# Patient Record
Sex: Female | Born: 1935 | Race: White | Hispanic: No | Marital: Single | Smoking: Former smoker
Health system: Southern US, Community
[De-identification: ages and names within clinical notes are randomized; demographics above are authoritative.]

## PROBLEM LIST (undated history)

## (undated) DIAGNOSIS — S0512XA Contusion of eyeball and orbital tissues, left eye, initial encounter: Secondary | ICD-10-CM

## (undated) DIAGNOSIS — N281 Cyst of kidney, acquired: Secondary | ICD-10-CM

## (undated) DIAGNOSIS — G450 Vertebro-basilar artery syndrome: Secondary | ICD-10-CM

## (undated) DIAGNOSIS — S62102A Fracture of unspecified carpal bone, left wrist, initial encounter for closed fracture: Secondary | ICD-10-CM

## (undated) DIAGNOSIS — S72411A Displaced unspecified condyle fracture of lower end of right femur, initial encounter for closed fracture: Secondary | ICD-10-CM

## (undated) DIAGNOSIS — C50919 Malignant neoplasm of unspecified site of unspecified female breast: Secondary | ICD-10-CM

## (undated) DIAGNOSIS — I4891 Unspecified atrial fibrillation: Secondary | ICD-10-CM

## (undated) DIAGNOSIS — I10 Essential (primary) hypertension: Secondary | ICD-10-CM

## (undated) DIAGNOSIS — I890 Lymphedema, not elsewhere classified: Secondary | ICD-10-CM

## (undated) DIAGNOSIS — K5792 Diverticulitis of intestine, part unspecified, without perforation or abscess without bleeding: Secondary | ICD-10-CM

## (undated) DIAGNOSIS — R1900 Intra-abdominal and pelvic swelling, mass and lump, unspecified site: Secondary | ICD-10-CM

## (undated) DIAGNOSIS — K259 Gastric ulcer, unspecified as acute or chronic, without hemorrhage or perforation: Secondary | ICD-10-CM

## (undated) HISTORY — DX: Unspecified atrial fibrillation: I48.91

## (undated) HISTORY — DX: Lymphedema, not elsewhere classified: I89.0

## (undated) HISTORY — DX: Gastric ulcer, unspecified as acute or chronic, without hemorrhage or perforation: K25.9

## (undated) HISTORY — DX: Displaced unspecified condyle fracture of lower end of right femur, initial encounter for closed fracture: S72.411A

## (undated) HISTORY — DX: Intra-abdominal and pelvic swelling, mass and lump, unspecified site: R19.00

## (undated) HISTORY — DX: Cyst of kidney, acquired: N28.1

## (undated) HISTORY — DX: Diverticulitis of intestine, part unspecified, without perforation or abscess without bleeding: K57.92

## (undated) HISTORY — PX: HIP SURGERY: SHX245

## (undated) HISTORY — PX: TONSILLECTOMY: SUR1361

## (undated) HISTORY — DX: Contusion of eyeball and orbital tissues, left eye, initial encounter: S05.12XA

## (undated) HISTORY — DX: Fracture of unspecified carpal bone, left wrist, initial encounter for closed fracture: S62.102A

## (undated) HISTORY — DX: Malignant neoplasm of unspecified site of unspecified female breast: C50.919

## (undated) HISTORY — PX: APPENDECTOMY: SHX54

## (undated) HISTORY — DX: Vertebro-basilar artery syndrome: G45.0

## (undated) HISTORY — DX: Essential (primary) hypertension: I10

---

## 2005-01-05 ENCOUNTER — Ambulatory Visit (HOSPITAL_COMMUNITY): Admission: RE | Admit: 2005-01-05 | Discharge: 2005-01-05 | Payer: Self-pay | Admitting: Neurosurgery

## 2005-01-13 ENCOUNTER — Ambulatory Visit (HOSPITAL_COMMUNITY): Admission: RE | Admit: 2005-01-13 | Discharge: 2005-01-13 | Payer: Self-pay | Admitting: Neurosurgery

## 2010-11-27 ENCOUNTER — Encounter: Payer: Self-pay | Admitting: Neurosurgery

## 2016-08-01 LAB — CBC AND DIFFERENTIAL
HEMATOCRIT: 37 % (ref 36–46)
Hemoglobin: 11.9 g/dL — AB (ref 12.0–16.0)
PLATELETS: 195 10*3/uL (ref 150–399)
WBC: 6 10*3/mL

## 2016-08-01 LAB — HEPATIC FUNCTION PANEL
ALT: 15 U/L (ref 7–35)
AST: 16 U/L (ref 13–35)
BILIRUBIN, TOTAL: 0.3 mg/dL

## 2016-08-01 LAB — BASIC METABOLIC PANEL
BUN: 28 mg/dL — AB (ref 4–21)
CREATININE: 0.8 mg/dL (ref 0.5–1.1)
Glucose: 121 mg/dL
Potassium: 4.5 mmol/L (ref 3.4–5.3)
SODIUM: 141 mmol/L (ref 137–147)

## 2016-08-01 LAB — POCT INR: INR: 2.3 — AB (ref 0.9–1.1)

## 2016-08-02 LAB — POCT INR: INR: 2.1 — AB (ref 0.9–1.1)

## 2016-08-03 LAB — BASIC METABOLIC PANEL
BUN: 15 mg/dL (ref 4–21)
CREATININE: 0.5 mg/dL (ref 0.5–1.1)
GLUCOSE: 155 mg/dL
POTASSIUM: 4.6 mmol/L (ref 3.4–5.3)
SODIUM: 137 mmol/L (ref 137–147)

## 2016-08-03 LAB — CBC AND DIFFERENTIAL
HCT: 30 % — AB (ref 36–46)
HEMOGLOBIN: 9.8 g/dL — AB (ref 12.0–16.0)
Platelets: 151 10*3/uL (ref 150–399)
WBC: 7.8 10^3/mL

## 2016-08-04 LAB — POCT INR: INR: 1.5 — AB (ref 0.9–1.1)

## 2016-08-04 LAB — CBC AND DIFFERENTIAL
HEMATOCRIT: 28 % — AB (ref 36–46)
HEMOGLOBIN: 9 g/dL — AB (ref 12.0–16.0)
Platelets: 144 10*3/uL — AB (ref 150–399)
WBC: 8 10*3/mL

## 2016-08-05 LAB — POCT INR: INR: 2 — AB (ref 0.9–1.1)

## 2016-08-06 DIAGNOSIS — S72411A Displaced unspecified condyle fracture of lower end of right femur, initial encounter for closed fracture: Secondary | ICD-10-CM

## 2016-08-06 HISTORY — DX: Displaced unspecified condyle fracture of lower end of right femur, initial encounter for closed fracture: S72.411A

## 2016-08-06 HISTORY — PX: ORIF FEMUR FRACTURE: SHX2119

## 2016-08-06 LAB — POCT INR: INR: 2.2 — AB (ref 0.9–1.1)

## 2016-08-07 LAB — CBC AND DIFFERENTIAL
HCT: 26 % — AB (ref 36–46)
HEMOGLOBIN: 8.6 g/dL — AB (ref 12.0–16.0)
Platelets: 196 10*3/uL (ref 150–399)
WBC: 6 10^3/mL

## 2016-08-07 LAB — POCT INR: INR: 1.9 — AB (ref 0.9–1.1)

## 2016-08-08 ENCOUNTER — Non-Acute Institutional Stay (SKILLED_NURSING_FACILITY): Payer: Medicare Other | Admitting: Internal Medicine

## 2016-08-08 ENCOUNTER — Encounter: Payer: Self-pay | Admitting: Internal Medicine

## 2016-08-08 DIAGNOSIS — S62102S Fracture of unspecified carpal bone, left wrist, sequela: Secondary | ICD-10-CM

## 2016-08-08 DIAGNOSIS — I4891 Unspecified atrial fibrillation: Secondary | ICD-10-CM | POA: Diagnosis not present

## 2016-08-08 DIAGNOSIS — G459 Transient cerebral ischemic attack, unspecified: Secondary | ICD-10-CM | POA: Diagnosis not present

## 2016-08-08 DIAGNOSIS — K5901 Slow transit constipation: Secondary | ICD-10-CM | POA: Diagnosis not present

## 2016-08-08 DIAGNOSIS — I10 Essential (primary) hypertension: Secondary | ICD-10-CM | POA: Diagnosis not present

## 2016-08-08 DIAGNOSIS — R2681 Unsteadiness on feet: Secondary | ICD-10-CM | POA: Diagnosis not present

## 2016-08-08 DIAGNOSIS — D62 Acute posthemorrhagic anemia: Secondary | ICD-10-CM | POA: Diagnosis not present

## 2016-08-08 DIAGNOSIS — S72401S Unspecified fracture of lower end of right femur, sequela: Secondary | ICD-10-CM

## 2016-08-08 NOTE — Progress Notes (Signed)
LOCATION: [facility]  PCP: No primary care provider on file.   Code Status: DNR  Goals of care: Advanced Directive information Advanced Directives 08/08/2016  Does patient have an advance directive? Yes  Type of Advance Directive Out of facility DNR (pink MOST or yellow form)  Does patient want to make changes to advanced directive? No - Patient declined  Copy of advanced directive(s) in chart? Yes       Extended Emergency Contact Information Primary Emergency Contact: Wright,Greg Address: [address], [place] [address] [place] of [place] Phone: [phone] Mobile Phone: [phone] Relation: Son Secondary Emergency Contact: Clydene Fake Address: [address]          [place], [place] [address] [place] Phone: [phone] Mobile Phone: [phone] Relation: Daughter   Allergies  Allergen Reactions  . Sulfa Antibiotics     Chief Complaint  Patient presents with  . New Admit To SNF    New Admission Visit     HPI:  Patient is a 82 y.o. female seen today for short term rehabilitation post hospital admission from 08/01/16-08/07/16 post fall with right femoral closed fracture and left wrist closed fracture. She had cast placed to LUE and ORIF to her right femur. She has PMH of TIA, breast cancer, HTN, afib among others. She is seen in her room today with her son present.   Review of Systems:  Constitutional: Negative for fever, chills, diaphoresis.  HENT: Negative for headache, congestion, nasal discharge Eyes: Negative for blurred vision, double vision and discharge.  Respiratory: Negative for shortness of breath and wheezing. Positive for occasional cough.  Cardiovascular: Negative for chest pain, palpitations, leg swelling.  Gastrointestinal: Negative for heartburn, nausea, vomiting, abdominal pain. Positive for poor appetite. Last bowel movement was Thursday and then she has had a small bowel movement. She  has been passing flatus. Genitourinary: Negative for dysuria Musculoskeletal: Negative for back pain, fall in the facility.  Skin: Negative for itching, rash.  Neurological: Positive for occasional dizziness Psychiatric/Behavioral: Negative for depression   Past Medical History:  Diagnosis Date  . Atrial fibrillation ([place])   . Breast cancer ([place])   . Diverticulitis   . Gastric ulcer   . Hypertension   . Lymph edema    Past Surgical History:  Procedure Laterality Date  . APPENDECTOMY    . HIP SURGERY    . TONSILLECTOMY     Social History:   reports that she has quit smoking. She does not have any smokeless tobacco history on file. She reports that she drinks about 4.2 oz of alcohol per week . Her drug history is not on file.  Family History  Problem Relation Age of Onset  . Heart disease Mother     Medications:   Medication List       Accurate as of 08/08/16 12:30 PM. Always use your most recent med list.          atorvastatin 20 MG tablet Commonly known as:  LIPITOR Take 20 mg by mouth daily.   diltiazem 60 MG tablet Commonly known as:  CARDIZEM Take 50 mg by mouth 2 (two) times daily.   flecainide 50 MG tablet Commonly known as:  TAMBOCOR Take 50 mg by mouth 2 (two) times daily.   HYDROcodone-acetaminophen 5-325 MG tablet Commonly known as:  NORCO/VICODIN Take 1 tablet by mouth every 4 (four) hours as needed for moderate pain.   lisinopril  20 MG tablet Commonly known as:  PRINIVIL,ZESTRIL Take 20 mg by mouth daily.   warfarin 5 MG tablet Commonly known as:  COUMADIN Take 5 mg by mouth daily. Stop date 08/10/16       Immunizations:  There is no immunization history on file for this patient.   Physical Exam:  Vitals:   08/08/16 1209  BP: 126/74  Pulse: 78  Resp: 18  Temp: 98.5 F (36.9 C)  TempSrc: Oral  SpO2: 98%  Weight: 158 lb (71.7 kg)  Height: 5\' 5"  (1.651 m)   Body mass index is 26.29 kg/m.  General- elderly female, well  built, in no acute distress Head- normocephalic, atraumatic Throat- moist mucus membrane  Eyes- PERRLA, EOMI, no pallor, no icterus, no discharge, normal conjunctiva, normal sclera, left periorbital hematoma Neck- no cervical lymphadenopathy Cardiovascular- normal s1,s2, no murmur, no leg edema Respiratory- bilateral clear to auscultation, no wheeze, no rhonchi, no crackles, no use of accessory muscles Abdomen- bowel sounds present, soft, non tender Musculoskeletal- able to move all 4 extremities, left wrist in cast, able to move her fingers and they have good capillary refill, right knee brace in place Neurological- alert and oriented to person, place and time, Skin- warm and dry Psychiatry- normal mood and affect    Labs reviewed: Basic Metabolic Panel:  Recent Labs  08/01/16 08/03/16  NA 141 137  K 4.5 4.6  BUN 28* 15  CREATININE 0.8 0.5   Liver Function Tests:  Recent Labs  08/01/16  AST 16  ALT 15   No results for input(s): LIPASE, AMYLASE in the last 8760 hours. No results for input(s): AMMONIA in the last 8760 hours. CBC:  Recent Labs  08/03/16 08/04/16 08/07/16  WBC 7.8 8.0 6.0  HGB 9.8* 9.0* 8.6*  HCT 30* 28* 26*  PLT 151 144* 196   Cardiac Enzymes: No results for input(s): CKTOTAL, CKMB, CKMBINDEX, TROPONINI in the last 8760 hours. BNP: Invalid input(s): POCBNP CBG: No results for input(s): GLUCAP in the last 8760 hours.  Radiological Exams: Xr Chest Pa or Ap 08/01/16  Impression. No acute osseous abnormalities are seen. Scattered clips are noted overlying the right axillia. No acute cardiopulmonary process seen. No displaced rib fracture seen.  Xr Wrist 3 or More Views Left 08/01/16 Impression. Mildly comminuted fracture at the distal radial metaphysis, with mild dorsal tilt and mild impaction, extending to the radiocarpal joint. Small ulnar styloid fracture also seen.  Xr Femur 2 Views Right 08/01/16  Impression. Comminuted and mildly impacted  fracture of the distal femoral condyles, with extension to the articular surface and posterior displacement of fragments. The proximal tibia articulates with the displaced fragments. Associated moderate lipohemarthosis noted.  Cta Aortic Dissection 08/02/16 Impression. No CT evidence of aortic dissection or pulmonary embolism. An incidental finding of potential clinical significance has been found. A 6 mm left upper lobe pulmonary nodule. Non- contrast chest CT at 6-12 months is recommended. If the nodule is stable at time of repeat CT, then future CT at 18-24 months (from today's scan) is considered optional for low-risk patients, but is recommended for high-risk patients. This recommendation follows the consensus statement.  Ct Head Cervical Spine Maxillofacial Wo Contrast 08/01/16 Impression. No acute intracranial abnormality. Aged related parenchymal volume loss. Left sided periorbital hematoma. No facial bone fractures identified. Cervical degenerative disc disease. No fractures or subluxations.  Xr Knee 4 or More Views Left 08/01/16 Impression. No evidence of fracture or dislocation. Minimal degenerative change at the left knee.  Xr Knee 4 or More Views Right 08/01/16 Impression. Comminuted fracture of the femoral condyles, extending to the articular surface, with medial and posterior displacement of he condyles, and mild shortening and impaction. Moderate lipohemarthrosis noted.    Assessment/Plan  Unsteady gait Will have patient work with PT/OT as tolerated to regain strength and restore function.  Fall precautions are in place.  Right femoral fracture S/p ORIF, has orthopedic follow up. NWB to RLE. Has knee brace in place. Will have her work with physical therapy and occupational therapy team to help with gait training and muscle strengthening exercises.fall precautions. Skin care. Encourage to be out of bed. Continue norco 5-325 mg q4h prn pain. Physiatry consult  Left wrist  fracture S/p cast to LUE. Has orthopedic follow up. Continue norco as above for pain. Physiatry consult  Blood loss anemia Monitor cbc, post op with blood loss from surgery  Constipation Start senna s 2 tab qhs and miralax daily for now and monitor  afib Rate controlled. Continue diltiazem 50 mg bid and coumadin with goal inr 2-3  HLD Continue atorvastatin  TIA Continue statin and coumadin for stroke prophylaxis  HTN Continue lisinopril and monitor bp readings q shift for now    Goals of care: short term rehabilitation   Labs/tests ordered: cbc, cmp  Family/ staff Communication: reviewed care plan with patient and nursing supervisor    Blanchie Serve, MD Internal Medicine [facility] [place],  [address] Cell Phone (Monday-Friday 8 am - 5 pm): [phone] On Call: [phone] and follow prompts after 5 pm and on weekends Office Phone: [phone] Office Fax: [phone]

## 2016-08-09 LAB — BASIC METABOLIC PANEL
BUN: 19 mg/dL (ref 4–21)
CREATININE: 0.6 mg/dL (ref 0.5–1.1)
GLUCOSE: 90 mg/dL
Potassium: 5 mmol/L (ref 3.4–5.3)
SODIUM: 141 mmol/L (ref 137–147)

## 2016-08-09 LAB — CBC AND DIFFERENTIAL
HCT: 31 % — AB (ref 36–46)
Hemoglobin: 9.7 g/dL — AB (ref 12.0–16.0)
Neutrophils Absolute: 3 /uL
Platelets: 268 10*3/uL (ref 150–399)
WBC: 5.9 10^3/mL

## 2016-08-09 LAB — HEPATIC FUNCTION PANEL
ALK PHOS: 71 U/L (ref 25–125)
ALT: 12 U/L (ref 7–35)
AST: 12 U/L — AB (ref 13–35)
BILIRUBIN, TOTAL: 0.7 mg/dL

## 2016-08-10 ENCOUNTER — Encounter: Payer: Self-pay | Admitting: Adult Health

## 2016-08-10 ENCOUNTER — Non-Acute Institutional Stay (SKILLED_NURSING_FACILITY): Payer: Medicare Other | Admitting: Adult Health

## 2016-08-10 DIAGNOSIS — I4891 Unspecified atrial fibrillation: Secondary | ICD-10-CM | POA: Diagnosis not present

## 2016-08-10 DIAGNOSIS — Z7901 Long term (current) use of anticoagulants: Secondary | ICD-10-CM | POA: Diagnosis not present

## 2016-08-10 NOTE — Progress Notes (Signed)
Subjective:     Indication: atrial fibrillation Bleeding signs/symptoms: None Thromboembolic signs/symptoms: None  Missed Coumadin doses: None Medication changes: no Dietary changes: no Bacterial/viral infection: no Other concerns: no  The following portions of the patient's history were reviewed and updated as appropriate: allergies, current medications, past family history, past medical history, past social history, past surgical history and problem list.  Review of Systems A comprehensive review of systems was negative.   Objective:    INR Today: 1.6 Current dose: Coumadin 5 mg daily    Assessment:    Subtherapeutic INR for goal of 2-3   Plan:    1. New dose: Coumadin 7.5 mg PO X 1 today then 6 mg PO Q D   2. Next INR: 08/14/16

## 2016-09-07 ENCOUNTER — Non-Acute Institutional Stay (SKILLED_NURSING_FACILITY): Payer: Medicare Other | Admitting: Adult Health

## 2016-09-07 ENCOUNTER — Encounter: Payer: Self-pay | Admitting: Adult Health

## 2016-09-07 DIAGNOSIS — R2681 Unsteadiness on feet: Secondary | ICD-10-CM | POA: Diagnosis not present

## 2016-09-07 DIAGNOSIS — K5901 Slow transit constipation: Secondary | ICD-10-CM

## 2016-09-07 DIAGNOSIS — S72401S Unspecified fracture of lower end of right femur, sequela: Secondary | ICD-10-CM | POA: Diagnosis not present

## 2016-09-07 DIAGNOSIS — G4700 Insomnia, unspecified: Secondary | ICD-10-CM

## 2016-09-07 DIAGNOSIS — S62102S Fracture of unspecified carpal bone, left wrist, sequela: Secondary | ICD-10-CM | POA: Diagnosis not present

## 2016-09-07 DIAGNOSIS — E785 Hyperlipidemia, unspecified: Secondary | ICD-10-CM | POA: Diagnosis not present

## 2016-09-07 DIAGNOSIS — G459 Transient cerebral ischemic attack, unspecified: Secondary | ICD-10-CM

## 2016-09-07 DIAGNOSIS — I10 Essential (primary) hypertension: Secondary | ICD-10-CM | POA: Diagnosis not present

## 2016-09-07 DIAGNOSIS — D62 Acute posthemorrhagic anemia: Secondary | ICD-10-CM

## 2016-09-07 DIAGNOSIS — I4891 Unspecified atrial fibrillation: Secondary | ICD-10-CM | POA: Diagnosis not present

## 2016-09-07 NOTE — Progress Notes (Signed)
Patient ID: Deborah Cox, female   DOB: [birth year], 82 y.o.   MRN: [id number]    DATE:    09/07/16  MRN:  [id number]  BIRTHDAY: 1934/05/03  Facility:  [facility] Location:  [place] [place] [facility] [place] Room Number: [id number]  LEVEL OF CARE:  SNF [phone])  Contact Information    Name Relation Home Work Mobile   [place] Son [phone]  [phone]   Clydene Fake Daughter [phone]  [phone]   Conan Bowens [phone]  [phone]       Code Status History    This patient does not have a recorded code status. Please follow your organizational policy for patients in this situation.    Advance Directive Documentation   Flowsheet Row Most Recent Value  Type of Advance Directive  Healthcare Power of Attorney, Out of facility DNR (pink MOST or yellow form)  Pre-existing out of facility DNR order (yellow form or pink MOST form)  No data  "MOST" Form in Place?  No data       Chief Complaint  Patient presents with  . Medical Management of Chronic Issues    HISTORY OF PRESENT ILLNESS:  This is an 82 year old female who is currently having a short-term rehabilitation.  Trazodone was recently ordered for insomnia and medpass for supplementation.  She has been admitted to [facility] on 08/07/16 from [facility] hospitalization 08/01/16 to 08/07/16. She has PMH of TIA, breast cancer, hypertension and atrial fibrillation. She had a fall and sustained right femoral closed fracture and left wrist closed fracture. She had a cast placed on LUE and ORIF to her right femur fracture.   PAST MEDICAL HISTORY:  Past Medical History:  Diagnosis Date  . Atrial fibrillation ([place])   . Breast cancer ([place])   . Diverticulitis   . Gastric ulcer   . Hypertension   . Lymph edema      CURRENT MEDICATIONS: Reviewed  Patient's Medications  New Prescriptions   No medications on file  Previous Medications   ATORVASTATIN (LIPITOR) 20 MG TABLET     Take 20 mg by mouth at bedtime.    DILTIAZEM (CARDIZEM) 60 MG TABLET    Take 60 mg by mouth 2 (two) times daily.    FLECAINIDE (TAMBOCOR) 50 MG TABLET    Take 25 mg by mouth 2 (two) times daily. Take 1/2 of a 50 mg tablet to = 25 mg BID   HYDROCODONE-ACETAMINOPHEN (NORCO/VICODIN) 5-325 MG TABLET    Take 1 tablet by mouth every 4 (four) hours as needed for moderate pain.   LISINOPRIL (PRINIVIL,ZESTRIL) 20 MG TABLET    Take 20 mg by mouth daily.    NUTRITIONAL SUPPLEMENT LIQD    Take 120 mLs by mouth 3 (three) times daily. MedPass   POLYETHYLENE GLYCOL (MIRALAX / GLYCOLAX) PACKET    Take 17 g by mouth daily.   SENNOSIDES-DOCUSATE SODIUM (SENOKOT-S) 8.6-50 MG TABLET    Take 2 tablets by mouth at bedtime.   TRAZODONE (DESYREL) 50 MG TABLET    Take 25 mg by mouth at bedtime. Give 1/2 of a 50 mg tablet to = 25 mg qhs   WARFARIN (COUMADIN) 6 MG TABLET    Take 6 mg by mouth. Sun-Sat-Tue-Wed-Thur   WARFARIN (COUMADIN) 7.5 MG TABLET    Take 7.5 mg by mouth 2 (two) times a week. Monday and Friday  Modified Medications   No medications on file  Discontinued Medications   No medications on file  Allergies  Allergen Reactions  . Sulfa Antibiotics      REVIEW OF SYSTEMS:  GENERAL: no change in appetite, no fatigue, no weight changes, no fever, chills or weakness EYES: Denies change in vision, dry eyes, eye pain, itching or discharge EARS: Denies change in hearing, ringing in ears, or earache NOSE: Denies nasal congestion or epistaxis MOUTH and THROAT: Denies oral discomfort, gingival pain or bleeding, pain from teeth or hoarseness   RESPIRATORY: no cough, SOB, DOE, wheezing, hemoptysis CARDIAC: no chest pain, edema or palpitations GI: no abdominal pain, diarrhea, constipation, heart burn, nausea or vomiting GU: Denies dysuria, frequency, hematuria, incontinence, or discharge PSYCHIATRIC: Denies feeling of depression or anxiety. No report of hallucinations, insomnia, paranoia, or  agitation   PHYSICAL EXAMINATION  GENERAL APPEARANCE: Well nourished. In no acute distress. Normal body habitus SKIN:  Left cheek with hematoma.  HEAD: Normal in size and contour. No evidence of trauma EYES: Lids open and close normally. No blepharitis, entropion or ectropion. PERRL. Conjunctivae are clear and sclerae are white. Lenses are without opacity EARS: Pinnae are normal. Patient hears normal voice tunes of the examiner MOUTH and THROAT: Lips are without lesions. Oral mucosa is moist and without lesions. Tongue is normal in shape, size, and color and without lesions NECK: supple, trachea midline, no neck masses, no thyroid tenderness, no thyromegaly LYMPHATICS: no LAN in the neck, no supraclavicular LAN RESPIRATORY: breathing is even & unlabored, BS CTAB CARDIAC: RRR, no murmur,no extra heart sounds, RUE 2+ edema GI: abdomen soft, normal BS, no masses, no tenderness, no hepatomegaly, no splenomegaly EXTREMITIES:  Left forearm with cast, right knee with hinge brace PSYCHIATRIC: Alert and oriented X 3. Affect and behavior are appropriate  LABS/RADIOLOGY: Labs reviewed: Basic Metabolic Panel:  Recent Labs  08/01/16 08/03/16 08/09/16  NA 141 137 141  K 4.5 4.6 5.0  BUN 28* 15 19  CREATININE 0.8 0.5 0.6   Liver Function Tests:  Recent Labs  08/01/16 08/09/16  AST 16 12*  ALT 15 12  ALKPHOS  --  71   CBC:  Recent Labs  08/04/16 08/07/16 08/09/16  WBC 8.0 6.0 5.9  NEUTROABS  --   --  3  HGB 9.0* 8.6* 9.7*  HCT 28* 26* 31*  PLT 144* 196 268     ASSESSMENT/PLAN:  Unsteady gait - continue rehabilitation, PT and OT, for therapeutic strengthening exercises; fall precaution  Right femoral fracture S/P ORIF - follow-up with orthopedic surgeon; continue Norco 5/325 mg 1 tab by mouth every 4 hours when necessary; followed-up by physiatry   Left wrist fracture - with cast; continue Norco 5/325 mg 1 tab by mouth every 4 hours when necessary   Atrial fibrillation -  rate controlled; continue Cardizem 60 mg 1 tab by mouth twice a day, flecainide 50 mg 1/2 tab = 25 mg by mouth twice a day and Coumadin  Hypertension - continue lisinopril 20 mg 1 tab by mouth daily, Cardizem 60 mg 1 tab by mouth twice a day  Insomnia - continue trazodone 50 mg 1/2 tab = 25 mg PO Q HS  Hyperlipidemia - continue Lipitor 20 mg 1 tab by mouth daily at bedtime  Constipation - continue senna S 2 tabs by mouth daily at bedtime and MiraLAX 17 g by mouth daily  TIA - continue Lipitor 20 mg 1 tab PO Q HS  Anemia, acute blood loss - re-check CBC Lab Results  Component Value Date   HGB 9.7 (A) 08/09/2016  Goals of care:  Short-term rehabilitation    Durenda Age, NP [facility] [phone]

## 2016-09-08 LAB — CBC AND DIFFERENTIAL
HCT: 31 % — AB (ref 36–46)
Hemoglobin: 10 g/dL — AB (ref 12.0–16.0)
Platelets: 215 10*3/uL (ref 150–399)
WBC: 3.6 10*3/mL

## 2016-10-06 ENCOUNTER — Encounter: Payer: Self-pay | Admitting: Adult Health

## 2016-10-06 ENCOUNTER — Non-Acute Institutional Stay (SKILLED_NURSING_FACILITY): Payer: Medicare Other | Admitting: Adult Health

## 2016-10-06 DIAGNOSIS — G4700 Insomnia, unspecified: Secondary | ICD-10-CM | POA: Diagnosis not present

## 2016-10-06 DIAGNOSIS — S72401S Unspecified fracture of lower end of right femur, sequela: Secondary | ICD-10-CM | POA: Diagnosis not present

## 2016-10-06 DIAGNOSIS — E785 Hyperlipidemia, unspecified: Secondary | ICD-10-CM | POA: Diagnosis not present

## 2016-10-06 DIAGNOSIS — R2681 Unsteadiness on feet: Secondary | ICD-10-CM

## 2016-10-06 DIAGNOSIS — S62102S Fracture of unspecified carpal bone, left wrist, sequela: Secondary | ICD-10-CM

## 2016-10-06 DIAGNOSIS — I10 Essential (primary) hypertension: Secondary | ICD-10-CM | POA: Diagnosis not present

## 2016-10-06 DIAGNOSIS — D62 Acute posthemorrhagic anemia: Secondary | ICD-10-CM

## 2016-10-06 DIAGNOSIS — G459 Transient cerebral ischemic attack, unspecified: Secondary | ICD-10-CM

## 2016-10-06 DIAGNOSIS — I4891 Unspecified atrial fibrillation: Secondary | ICD-10-CM

## 2016-10-06 DIAGNOSIS — K5901 Slow transit constipation: Secondary | ICD-10-CM

## 2016-10-06 NOTE — Progress Notes (Addendum)
DATE:  10/06/2016   MRN:  [id number]  BIRTHDAY: 06/26/1934  Facility:  Nursing Home Location:  [place] Room Number: [id number]  LEVEL OF CARE:  SNF [phone])  Contact Information    Name Relation Home Work Mobile   [place] Son [phone]  [place] Daughter [phone]  [phone]   Conan Bowens [phone]  [phone]       Code Status History    This patient does not have a recorded code status. Please follow your organizational policy for patients in this situation.    Advance Directive Documentation   Flowsheet Row Most Recent Value  Type of Advance Directive  Out of facility DNR (pink MOST or yellow form), Healthcare Power of Attorney  Pre-existing out of facility DNR order (yellow form or pink MOST form)  No data  "MOST" Form in Place?  No data       Chief Complaint  Patient presents with  . Medical Management of Chronic Issues    HISTORY OF PRESENT ILLNESS:  This is an 82 year old female who is being seen for a routine visit. She is currently having a short-term rehabilitation @ [facility].  She has been admitted to [facility] on 08/07/16 from [facility] hospitalization 08/01/16 to 08/07/16. She has PMH of TIA, breast cancer, hypertension and atrial fibrillation. She had a fall and sustained right femoral closed fracture and left wrist closed fracture. She had a cast placed on LUE and ORIF to her right femur fracture.  Flecainide was recently decreased to 25 mg BID and Trazodone was changed to PRN instead of routinely Q HS.   PAST MEDICAL HISTORY:  Past Medical History:  Diagnosis Date  . Atrial fibrillation ([place])   . Breast cancer ([place])   . Diverticulitis   . Gastric ulcer   . Hypertension   . Lymph edema      CURRENT MEDICATIONS: Reviewed  Patient's Medications  New Prescriptions   No medications on file  Previous Medications   ATORVASTATIN  (LIPITOR) 20 MG TABLET    Take 20 mg by mouth at bedtime.    DILTIAZEM (CARDIZEM) 60 MG TABLET    Take 60 mg by mouth 2 (two) times daily.    FLECAINIDE (TAMBOCOR) 50 MG TABLET    Take 25 mg by mouth 2 (two) times daily. Take 1/2 of a 50 mg tablet to = 25 mg BID   HYDROCODONE-ACETAMINOPHEN (NORCO/VICODIN) 5-325 MG TABLET    Take 1 tablet by mouth every 4 (four) hours as needed for moderate pain.   LISINOPRIL (PRINIVIL,ZESTRIL) 20 MG TABLET    Take 20 mg by mouth daily.    MENTHOL (ICY HOT) 5 % PTCH    Apply 1 patch topically daily. Apply to mid back.  Check for skin integrity,   NUTRITIONAL SUPPLEMENT LIQD    Take 120 mLs by mouth 3 (three) times daily. MedPass   POLYETHYLENE GLYCOL (MIRALAX / GLYCOLAX) PACKET    Take 17 g by mouth daily.   SENNOSIDES-DOCUSATE SODIUM (SENOKOT-S) 8.6-50 MG TABLET    Take 2 tablets by mouth at bedtime.   TRAZODONE (DESYREL) 50 MG TABLET    Take 25 mg by mouth at bedtime as needed. Give 1/2 of a 50 mg tablet to = 25 mg qhs prn   WARFARIN (COUMADIN) 6 MG TABLET    Take 6 mg by mouth 4 (four) times a week. Tue-Thu-Sat-Sun   WARFARIN (COUMADIN) 7.5 MG  TABLET    Take 7.5 mg by mouth 3 (three) times a week. M-W-F  Modified Medications   No medications on file  Discontinued Medications   No medications on file     Allergies  Allergen Reactions  . Sulfa Antibiotics      REVIEW OF SYSTEMS:  GENERAL: no change in appetite, no fatigue, no weight changes, no fever, chills or weakness EYES: Denies change in vision, dry eyes, eye pain, itching or discharge EARS: Denies change in hearing, ringing in ears, or earache NOSE: Denies nasal congestion or epistaxis MOUTH and THROAT: Denies oral discomfort, gingival pain or bleeding, pain from teeth or hoarseness   RESPIRATORY: no cough, SOB, DOE, wheezing, hemoptysis CARDIAC: no chest pain or palpitations GI: no abdominal pain, diarrhea, constipation, heart burn, nausea or vomiting GU: Denies dysuria, frequency,  hematuria, incontinence, or discharge PSYCHIATRIC: Denies feeling of depression or anxiety. No report of hallucinations, insomnia, paranoia, or agitation     PHYSICAL EXAMINATION  GENERAL APPEARANCE: Well nourished. In no acute distress. Normal body habitus SKIN:  Skin is warm and dry, right knee surgical site is healed HEAD: Normal in size and contour. No evidence of trauma EYES: Lids open and close normally. No blepharitis, entropion or ectropion. PERRL. Conjunctivae are clear and sclerae are white. Lenses are without opacity EARS: Pinnae are normal. Patient hears normal voice tunes of the examiner MOUTH and THROAT: Lips are without lesions. Oral mucosa is moist and without lesions. Tongue is normal in shape, size, and color and without lesions NECK: supple, trachea midline, no neck masses, no thyroid tenderness, no thyromegaly LYMPHATICS: no LAN in the neck, no supraclavicular LAN RESPIRATORY: breathing is even & unlabored, BS CTAB CARDIAC: RRR, no murmur,no extra heart sounds, RUE 2+ edema GI: abdomen soft, normal BS, no masses, no tenderness, no hepatomegaly, no splenomegaly EXTREMITY:  Able to move X 4 extremities PSYCHIATRIC: Alert and oriented X 3. Affect and behavior are appropriate  LABS/RADIOLOGY: Labs reviewed: Basic Metabolic Panel:  Recent Labs  08/01/16 08/03/16 08/09/16  NA 141 137 141  K 4.5 4.6 5.0  BUN 28* 15 19  CREATININE 0.8 0.5 0.6   Liver Function Tests:  Recent Labs  08/01/16 08/09/16  AST 16 12*  ALT 15 12  ALKPHOS  --  71   CBC:  Recent Labs  08/07/16 08/09/16 09/08/16  WBC 6.0 5.9 3.6  NEUTROABS  --  3  --   HGB 8.6* 9.7* 10.0*  HCT 26* 31* 31*  PLT 196 268 215    ASSESSMENT/PLAN:  1. Unsteady gait - continue rehabilitation with PT and OT, for therapeutic strengthening exercises; fall precautions   2. Closed fracture of distal end of right femur, unspecified fracture morphology, sequela - rehabilitation with PT and OT, for  therapeutic strengthening exercises; continue Norco 5/325 mg 1 tab by mouth every 4 hours when necessary for pain; NWB on RLE; follow-up with orthopedic surgeon   3. Closed fracture of left wrist, sequela - continue rehabilitation with OT for therapeutic strengthening exercises; cast was recently discontinued on left wrist, follow-up with orthopedics, continue Norco 5/325 mg 1 tab by mouth every 4 hours when necessary for pain   4. Atrial fibrillation, unspecified type ([place]) - continue flecainide 50 mg 1/2 tab = 25 mg by mouth twice a day, Cardizem 60 mg 1 tab by mouth twice a day and Coumadin   5. Essential hypertension, benign - controlled; continue lisinopril 20 mg 1 tab by mouth daily and Cardizem 60 mg  1 tab by mouth twice a day   6. Acute blood loss anemia - stable Lab Results  Component Value Date   HGB 10.0 (A) 09/08/2016     7. Hyperlipidemia, unspecified hyperlipidemia type - continue Lipitor 20 mg 1 tab by mouth daily at bedtime   8. Insomnia, unspecified type - recently change trazodone to 25 mg 1 tab by mouth daily at bedtime when necessary   9. Slow transit constipation - continue senna S 8.6-50 mg 1 tab by mouth daily at bedtime and MiraLAX 17 g by mouth daily  10. Transient cerebral ischemia, unspecified type - continue Lipitor 20 mg 1 tab by mouth daily at bedtime     Goals of care:  Short-term rehabilitation     Monina C. Guernsey - NP [employer] [phone]

## 2016-10-19 ENCOUNTER — Other Ambulatory Visit: Payer: Self-pay | Admitting: *Deleted

## 2016-10-19 MED ORDER — HYDROCODONE-ACETAMINOPHEN 5-325 MG PO TABS: ORAL_TABLET | ORAL | 0 refills | Status: AC

## 2016-10-19 NOTE — Telephone Encounter (Signed)
[facility] #[phone] Fax: [phone] 

## 2016-10-20 ENCOUNTER — Encounter: Payer: Self-pay | Admitting: Adult Health

## 2016-10-20 ENCOUNTER — Non-Acute Institutional Stay (SKILLED_NURSING_FACILITY): Payer: Medicare Other | Admitting: Adult Health

## 2016-10-20 DIAGNOSIS — K5901 Slow transit constipation: Secondary | ICD-10-CM

## 2016-10-20 DIAGNOSIS — G459 Transient cerebral ischemic attack, unspecified: Secondary | ICD-10-CM

## 2016-10-20 DIAGNOSIS — G4700 Insomnia, unspecified: Secondary | ICD-10-CM | POA: Diagnosis not present

## 2016-10-20 DIAGNOSIS — S72401S Unspecified fracture of lower end of right femur, sequela: Secondary | ICD-10-CM

## 2016-10-20 DIAGNOSIS — I4891 Unspecified atrial fibrillation: Secondary | ICD-10-CM

## 2016-10-20 DIAGNOSIS — R2681 Unsteadiness on feet: Secondary | ICD-10-CM

## 2016-10-20 DIAGNOSIS — E785 Hyperlipidemia, unspecified: Secondary | ICD-10-CM | POA: Diagnosis not present

## 2016-10-20 DIAGNOSIS — D62 Acute posthemorrhagic anemia: Secondary | ICD-10-CM

## 2016-10-20 DIAGNOSIS — S62102S Fracture of unspecified carpal bone, left wrist, sequela: Secondary | ICD-10-CM

## 2016-10-20 DIAGNOSIS — I10 Essential (primary) hypertension: Secondary | ICD-10-CM

## 2016-10-20 NOTE — Progress Notes (Signed)
Patient ID: Deborah Cox, female   DOB: 12-Feb-1934, 82 y.o.   MRN: [id number]    DATE:   10/20/2016   MRN:  [id number]  BIRTHDAY: 09-25-1934  Facility:  Nursing Home Location:  [place] Room Number: [id number]  LEVEL OF CARE:  SNF [phone])  Contact Information    Name Relation Home Work Mobile   Deborah Cox Son [phone]  [phone]   Deborah Cox Daughter [phone]  [phone]   Deborah Cox [phone]  [phone]       Code Status History    This patient does not have a recorded code status. Please follow your organizational policy for patients in this situation.    Advance Directive Documentation   Flowsheet Row Most Recent Value  Type of Advance Directive  Out of facility DNR (pink MOST or yellow form), Healthcare Power of Attorney  Pre-existing out of facility DNR order (yellow form or pink MOST form)  No data  "MOST" Form in Place?  No data       Chief Complaint  Patient presents with  . Discharge Note    HISTORY OF PRESENT ILLNESS:  This is an 82 year old female who is for discharge home with Home health PT and OT.   She has been admitted to [facility] on 08/07/16 from [facility] hospitalization 08/01/16 to 08/07/16. She has PMH of TIA, breast cancer, hypertension and atrial fibrillation. She had a fall and sustained right femoral closed fracture and left wrist closed fracture. She had a cast placed on LUE and ORIF to her right femur fracture.  Patient was admitted to this facility for short-term rehabilitation after the patient's recent hospitalization.  Patient has completed SNF rehabilitation and therapy has cleared the patient for discharge.    PAST MEDICAL HISTORY:  Past Medical History:  Diagnosis Date  . Atrial fibrillation ([place])   . Breast cancer ([facility])    CIN  . Closed fracture of left wrist   . Diverticulitis   . Fracture of femoral condyle, right, closed ([place]) 08/2016  . Gastric  ulcer   . Hypertension   . Lymph edema   . Pulsatile abdominal mass   . Renal cysts, acquired, bilateral   . Traumatic hematoma of left orbit   . Vertebrobasilar artery syndrome      CURRENT MEDICATIONS: Reviewed  Patient's Medications  New Prescriptions   No medications on file  Previous Medications   ATORVASTATIN (LIPITOR) 20 MG TABLET    Take 20 mg by mouth at bedtime.    DILTIAZEM (CARDIZEM) 60 MG TABLET    Take 60 mg by mouth 2 (two) times daily.    FLECAINIDE (TAMBOCOR) 50 MG TABLET    Take 25 mg by mouth 2 (two) times daily. Take 1/2 of a 50 mg tablet to = 25 mg BID   HYDROCODONE-ACETAMINOPHEN (NORCO/VICODIN) 5-325 MG TABLET    Take one tablet by mouth every 8 hours as needed for moderate to severe pain. Hold for sedation. DNE 3gm of Tylenol in 24 hours   LISINOPRIL (PRINIVIL,ZESTRIL) 20 MG TABLET    Take 20 mg by mouth daily.    MENTHOL (ICY HOT) 5 % PTCH    Apply 1 patch topically daily. Apply to mid back.  Check for skin integrity,   NUTRITIONAL SUPPLEMENT LIQD    Take 120 mLs by mouth 3 (three) times daily. MedPass   POLYETHYLENE GLYCOL (MIRALAX / GLYCOLAX) PACKET    Take 17 g by mouth daily.  SENNOSIDES-DOCUSATE SODIUM (SENOKOT-S) 8.6-50 MG TABLET    Take 2 tablets by mouth at bedtime.   TRAZODONE (DESYREL) 50 MG TABLET    Take 25 mg by mouth at bedtime as needed. Give 1/2 of a 50 mg tablet to = 25 mg qhs prn   WARFARIN (COUMADIN) 6 MG TABLET    Take 6 mg by mouth 4 (four) times a week. Tue-Thu-Sat-Sun   WARFARIN (COUMADIN) 7.5 MG TABLET    Take 7.5 mg by mouth 3 (three) times a week. M-W-F  Modified Medications   No medications on file  Discontinued Medications   No medications on file     Allergies  Allergen Reactions  . Sulfa Antibiotics      REVIEW OF SYSTEMS:  GENERAL: no change in appetite, no fatigue, no weight changes, no fever, chills or weakness EYES: Denies change in vision, dry eyes, eye pain, itching or discharge EARS: Denies change in hearing,  ringing in ears, or earache NOSE: Denies nasal congestion or epistaxis MOUTH and THROAT: Denies oral discomfort, gingival pain or bleeding, pain from teeth or hoarseness   RESPIRATORY: no cough, SOB, DOE, wheezing, hemoptysis CARDIAC: no chest pain or palpitations GI: no abdominal pain, diarrhea, constipation, heart burn, nausea or vomiting GU: Denies dysuria, frequency, hematuria, incontinence, or discharge PSYCHIATRIC: Denies feeling of depression or anxiety. No report of hallucinations, insomnia, paranoia, or agitation     PHYSICAL EXAMINATION  GENERAL APPEARANCE: Well nourished. In no acute distress. Normal body habitus SKIN:  Skin is warm and dry, right knee surgical site is healed HEAD: Normal in size and contour. No evidence of trauma EYES: Lids open and close normally. No blepharitis, entropion or ectropion. PERRL. Conjunctivae are clear and sclerae are white. Lenses are without opacity EARS: Pinnae are normal. Patient hears normal voice tunes of the examiner MOUTH and THROAT: Lips are without lesions. Oral mucosa is moist and without lesions. Tongue is normal in shape, size, and color and without lesions NECK: supple, trachea midline, no neck masses, no thyroid tenderness, no thyromegaly LYMPHATICS: no LAN in the neck, no supraclavicular LAN RESPIRATORY: breathing is even & unlabored, BS CTAB CARDIAC: RRR, no murmur,no extra heart sounds, RUE 1+ edema GI: abdomen soft, normal BS, no masses, no tenderness, no hepatomegaly, no splenomegaly EXTREMITY:  Able to move X 4 extremities PSYCHIATRIC: Alert and oriented X 3. Affect and behavior are appropriate  LABS/RADIOLOGY: Labs reviewed: Basic Metabolic Panel:  Recent Labs  08/01/16 08/03/16 08/09/16  NA 141 137 141  K 4.5 4.6 5.0  BUN 28* 15 19  CREATININE 0.8 0.5 0.6   Liver Function Tests:  Recent Labs  08/01/16 08/09/16  AST 16 12*  ALT 15 12  ALKPHOS  --  71   CBC:  Recent Labs  08/07/16 08/09/16 09/08/16   WBC 6.0 5.9 3.6  NEUTROABS  --  3  --   HGB 8.6* 9.7* 10.0*  HCT 26* 31* 31*  PLT 196 268 215    ASSESSMENT/PLAN:  1. Unsteady gait - for Home health PT and OT, for therapeutic strengthening exercises; fall precautions   2. Closed fracture of distal end of right femur, unspecified fracture morphology, sequela - for Home health PT and OT, for therapeutic strengthening exercises; recently decreased  Norco 5/325 mg 1 tab by mouth from every 4 hours to Q 8 hours PRN when necessary for pain; NWB on RLE; follow-up with orthopedic surgeon   3. Closed fracture of left wrist, sequela - for home health OT for  therapeutic strengthening exercises;  follow-up with orthopedics, recently decreased Norco 5/325 mg 1 tab by mouth from every 4 hours to Q 8 hours PRN when necessary for pain   4. Atrial fibrillation, unspecified type ([place]) - continue flecainide 50 mg 1/2 tab = 25 mg by mouth twice a day, Cardizem 60 mg 1 tab by mouth twice a day and Coumadin   5. Essential hypertension, benign - well-controlled; continue lisinopril 20 mg 1 tab by mouth daily and Cardizem 60 mg 1 tab by mouth twice a day   6. Acute blood loss anemia - stable Lab Results  Component Value Date   HGB 10.0 (A) 09/08/2016     7. Hyperlipidemia, unspecified hyperlipidemia type - continue Lipitor 20 mg 1 tab by mouth daily at bedtime   8. Insomnia, unspecified type - continue trazodone to 25 mg 1 tab by mouth daily at bedtime when necessary   9. Slow transit constipation - continue senna S 8.6-50 mg 1 tab by mouth daily at bedtime and MiraLAX 17 g by mouth daily  10. Transient cerebral ischemia, unspecified type - continue Lipitor 20 mg 1 tab by mouth daily at bedtime      I have filled out patient's discharge paperwork and written prescriptions.  Patient will receive home health PT and OT.  DME provided:  None  Total discharge time: Less than 30 minutes  Discharge time involved coordination of the discharge  process with social worker, nursing staff and therapy department. Medical justification for home health services verified.    Monina C. [place] - NP [employer] [phone]

## 2017-02-13 ENCOUNTER — Other Ambulatory Visit: Payer: Self-pay | Admitting: Adult Health

## 2017-02-20 ENCOUNTER — Other Ambulatory Visit: Payer: Self-pay | Admitting: Adult Health

## 2019-08-04 ENCOUNTER — Emergency Department (HOSPITAL_BASED_OUTPATIENT_CLINIC_OR_DEPARTMENT_OTHER)
Admission: EM | Admit: 2019-08-04 | Discharge: 2019-08-04 | Disposition: A | Discharge status: Home / Self Care | Payer: Medicare Other | Attending: Emergency Medicine | Admitting: Emergency Medicine

## 2019-08-04 ENCOUNTER — Other Ambulatory Visit: Payer: Self-pay

## 2019-08-04 ENCOUNTER — Emergency Department (HOSPITAL_BASED_OUTPATIENT_CLINIC_OR_DEPARTMENT_OTHER): Payer: Medicare Other

## 2019-08-04 ENCOUNTER — Encounter (HOSPITAL_BASED_OUTPATIENT_CLINIC_OR_DEPARTMENT_OTHER): Payer: Self-pay

## 2019-08-04 DIAGNOSIS — Y939 Activity, unspecified: Secondary | ICD-10-CM | POA: Insufficient documentation

## 2019-08-04 DIAGNOSIS — Z87891 Personal history of nicotine dependence: Secondary | ICD-10-CM | POA: Diagnosis not present

## 2019-08-04 DIAGNOSIS — M25561 Pain in right knee: Secondary | ICD-10-CM | POA: Insufficient documentation

## 2019-08-04 DIAGNOSIS — Y92009 Unspecified place in unspecified non-institutional (private) residence as the place of occurrence of the external cause: Secondary | ICD-10-CM | POA: Insufficient documentation

## 2019-08-04 DIAGNOSIS — I4891 Unspecified atrial fibrillation: Secondary | ICD-10-CM | POA: Diagnosis not present

## 2019-08-04 DIAGNOSIS — Z79899 Other long term (current) drug therapy: Secondary | ICD-10-CM | POA: Insufficient documentation

## 2019-08-04 DIAGNOSIS — Z7901 Long term (current) use of anticoagulants: Secondary | ICD-10-CM | POA: Diagnosis not present

## 2019-08-04 DIAGNOSIS — Y999 Unspecified external cause status: Secondary | ICD-10-CM | POA: Insufficient documentation

## 2019-08-04 DIAGNOSIS — W19XXXA Unspecified fall, initial encounter: Secondary | ICD-10-CM | POA: Diagnosis not present

## 2019-08-04 DIAGNOSIS — S0083XA Contusion of other part of head, initial encounter: Secondary | ICD-10-CM | POA: Diagnosis not present

## 2019-08-04 DIAGNOSIS — I10 Essential (primary) hypertension: Secondary | ICD-10-CM | POA: Diagnosis not present

## 2019-08-04 DIAGNOSIS — Z23 Encounter for immunization: Secondary | ICD-10-CM | POA: Insufficient documentation

## 2019-08-04 DIAGNOSIS — S0990XA Unspecified injury of head, initial encounter: Secondary | ICD-10-CM | POA: Diagnosis present

## 2019-08-04 MED ORDER — TETANUS-DIPHTH-ACELL PERTUSSIS 5-2.5-18.5 LF-MCG/0.5 IM SUSP
0.5000 mL | Freq: Once | INTRAMUSCULAR | Status: AC
  Administered 2019-08-04: 20:00:00 0.5 mL via INTRAMUSCULAR
  Filled 2019-08-04: qty 0.5

## 2019-08-04 MED ORDER — ACETAMINOPHEN 325 MG PO TABS
650.0000 mg | ORAL_TABLET | Freq: Once | ORAL | Status: DC
  Filled 2019-08-04: qty 2

## 2019-08-04 NOTE — ED Triage Notes (Addendum)
Pt reports trip/fall ~1hour PTA-pain to right knee and lac to right eyebrow-slight oozing to lac-gauze/kling wrap applied by EMT-no LOC with fall-NAD-steady gait with own cane

## 2019-08-04 NOTE — Discharge Instructions (Addendum)
Please read instructions below. Apply ice to your face and knee for 20 minutes at a time to help with pain and swelling. You can take over-the-counter medications as needed for pain. Keep your wounds clean daily with soap and water.  You can apply a topical antibiotic ointment. Schedule an appointment with your primary care provider to follow up on your visit today. Return to the ER for severely worsening headache, vision changes, vomiting, if new numbness or tingling in your arms or legs.

## 2019-08-04 NOTE — ED Provider Notes (Signed)
[facility] Provider Note   CSN: [id number] Arrival date & time: 08/04/19  1846     History   Chief Complaint Chief Complaint  Patient presents with   Fall    HPI Deborah Cox is a 85 y.o. female with past medical history of atrial fibrillation on Coumadin, osteoporosis, hypertension, presenting to the emergency department after mechanical fall that occurred prior to arrival.  Patient states she was bending over to pick up something on the floor when she lost her balance fell forward hitting her right face, arm and knee on the ground.  No LOC.  She complains of a minor headache, no vision changes.  She has wounds to her right brow and eyelid.  She has a small wound to her right elbow that is weeping because she has chronic lymphedema to the right arm after lymph node resection many years ago for breast cancer.  She also has some pain to her right knee.  Denies neck or back pain, abdominal pain, chest, or new numbness or weakness in her extremities.     The history is provided by the patient.    Past Medical History:  Diagnosis Date   Atrial fibrillation ([place])    Breast cancer ([place])    CIN   Closed fracture of left wrist    Diverticulitis    Fracture of femoral condyle, right, closed ([place]) 08/2016   Gastric ulcer    Hypertension    Lymph edema    Pulsatile abdominal mass    Renal cysts, acquired, bilateral    Traumatic hematoma of left orbit    Vertebrobasilar artery syndrome     There are no active problems to display for this patient.   Past Surgical History:  Procedure Laterality Date   APPENDECTOMY     HIP SURGERY     ORIF FEMUR FRACTURE Left 08/2016   TONSILLECTOMY       OB History   No obstetric history on file.      Home Medications    Prior to Admission medications   Medication Sig Start Date End Date Taking? Authorizing Provider  atorvastatin (LIPITOR) 20 MG tablet Take 20 mg by mouth at bedtime.      [provider]  diltiazem (CARDIZEM) 60 MG tablet Take 60 mg by mouth 2 (two) times daily.     [provider]  flecainide (TAMBOCOR) 50 MG tablet Take 25 mg by mouth 2 (two) times daily. Take 1/2 of a 50 mg tablet to = 25 mg BID    [provider]  HYDROcodone-acetaminophen (NORCO/VICODIN) 5-325 MG tablet Take one tablet by mouth every 8 hours as needed for moderate to severe pain. Hold for sedation. DNE 3gm of Tylenol in 24 hours 10/19/16   Reed, Tiffany L, DO  lisinopril (PRINIVIL,ZESTRIL) 20 MG tablet Take 20 mg by mouth daily.     [provider]  Menthol (ICY HOT) 5 % PTCH Apply 1 patch topically daily. Apply to mid back.  Check for skin integrity,    [provider]  NUTRITIONAL SUPPLEMENT LIQD Take 120 mLs by mouth 3 (three) times daily. MedPass    [provider]  polyethylene glycol (MIRALAX / GLYCOLAX) packet Take 17 g by mouth daily.    [provider]  sennosides-docusate sodium (SENOKOT-S) 8.6-50 MG tablet Take 2 tablets by mouth at bedtime.    [provider]  traZODone (DESYREL) 50 MG tablet Take 25 mg by mouth at bedtime as needed. Give  1/2 of a 50 mg tablet to = 25 mg qhs prn    [provider]  warfarin (COUMADIN) 6 MG tablet Take 6 mg by mouth 4 (four) times a week. Tue-Thu-Sat-Sun    [provider]  warfarin (COUMADIN) 7.5 MG tablet Take 7.5 mg by mouth 3 (three) times a week. M-W-F    [provider]    Family History Family History  Problem Relation Age of Onset   Heart disease Mother     Social History Social History   Tobacco Use   Smoking status: Former Smoker   Smokeless tobacco: Never Used  Substance Use Topics   Alcohol use: Yes    Comment: daily   Drug use: Never     Allergies   Sulfa antibiotics   Review of Systems Review of Systems  Eyes: Negative for photophobia and visual disturbance.  Gastrointestinal: Negative for abdominal pain.    Musculoskeletal: Positive for arthralgias and myalgias. Negative for back pain and neck pain.  Skin: Positive for wound.  Neurological: Positive for headaches. Negative for syncope, weakness and numbness.  Hematological: Bruises/bleeds easily.  All other systems reviewed and are negative.    Physical Exam Updated Vital Signs BP 102/78 (BP Location: Left Arm)    Pulse 62    Temp 98.3 F (36.8 C) (Oral)    Resp 16    Ht 5\' 5"  (1.651 m)    Wt 60.8 kg    SpO2 97%    BMI 22.30 kg/m   Physical Exam Vitals signs and nursing note reviewed.  Constitutional:      General: She is not in acute distress.    Appearance: She is well-developed. She is not ill-appearing.  HENT:     Head: Normocephalic.     Comments: There is bruising and superficial abrasions to the right brow, right upper lid and right zygomatic bone with associated tenderness.  No obvious deformity or crepitus. Eyes:     Conjunctiva/sclera: Conjunctivae normal.  Neck:     Musculoskeletal: Normal range of motion and neck supple. No muscular tenderness.  Cardiovascular:     Rate and Rhythm: Normal rate and regular rhythm.  Pulmonary:     Effort: Pulmonary effort is normal. No respiratory distress.     Breath sounds: Normal breath sounds.  Abdominal:     General: Bowel sounds are normal.     Palpations: Abdomen is soft.     Tenderness: There is no abdominal tenderness. There is no guarding or rebound.  Musculoskeletal:     Comments: No midline spinal or paraspinal tenderness.  Skin:    General: Skin is warm.  Neurological:     Mental Status: She is alert.     Comments: Patient is ambulatory throughout the room on evaluation with steady gait.  No cranial nerve deficits.  EOMs intact.  PERRLA. Equal strength to bilateral upper and lower extremities.  Normal tone and coordination.  Normal finger-to-nose  Psychiatric:        Behavior: Behavior normal.      ED Treatments / Results  Labs (all labs ordered are listed,  but only abnormal results are displayed) Labs Reviewed - No data to display  EKG None  Radiology Ct Head Wo Contrast  Result Date: 08/04/2019 CLINICAL DATA:  Patient status post fall.  Right eyebrow laceration. EXAM: CT HEAD WITHOUT CONTRAST CT MAXILLOFACIAL WITHOUT CONTRAST CT CERVICAL SPINE WITHOUT CONTRAST TECHNIQUE: Multidetector CT imaging of the head, cervical spine, and maxillofacial structures were performed using the  standard protocol without intravenous contrast. Multiplanar CT image reconstructions of the cervical spine and maxillofacial structures were also generated. COMPARISON:  None. FINDINGS: CT HEAD FINDINGS Brain: Ventricles and sulci are appropriate for patient's age. Periventricular and subcortical white matter hypodensity compatible with chronic microvascular ischemic changes. Vascular: Unremarkable Skull: Intact Other: None CT MAXILLOFACIAL FINDINGS Osseous: No fracture or mandibular dislocation. No destructive process. Orbits: Intact Sinuses: Unremarkable Soft tissues: Right periorbital soft tissue swelling. CT CERVICAL SPINE FINDINGS Alignment: Straightening of the normal cervical lordosis. Skull base and vertebrae: Intact.  Mild degenerative changes. Soft tissues and spinal canal: No prevertebral fluid or swelling. No visible canal hematoma. Disc levels: No acute fracture. Degenerative disc disease most pronounced C6-7. Upper chest: Apical scarring. Other: None IMPRESSION: No acute intracranial process. No acute cervical spine fracture. No acute maxillofacial fracture. Electronically Signed   By: Lovey Newcomer M.D.   On: 08/04/2019 20:20   Ct Cervical Spine Wo Contrast  Result Date: 08/04/2019 CLINICAL DATA:  Patient status post fall.  Right eyebrow laceration. EXAM: CT HEAD WITHOUT CONTRAST CT MAXILLOFACIAL WITHOUT CONTRAST CT CERVICAL SPINE WITHOUT CONTRAST TECHNIQUE: Multidetector CT imaging of the head, cervical spine, and maxillofacial structures were performed using the  standard protocol without intravenous contrast. Multiplanar CT image reconstructions of the cervical spine and maxillofacial structures were also generated. COMPARISON:  None. FINDINGS: CT HEAD FINDINGS Brain: Ventricles and sulci are appropriate for patient's age. Periventricular and subcortical white matter hypodensity compatible with chronic microvascular ischemic changes. Vascular: Unremarkable Skull: Intact Other: None CT MAXILLOFACIAL FINDINGS Osseous: No fracture or mandibular dislocation. No destructive process. Orbits: Intact Sinuses: Unremarkable Soft tissues: Right periorbital soft tissue swelling. CT CERVICAL SPINE FINDINGS Alignment: Straightening of the normal cervical lordosis. Skull base and vertebrae: Intact.  Mild degenerative changes. Soft tissues and spinal canal: No prevertebral fluid or swelling. No visible canal hematoma. Disc levels: No acute fracture. Degenerative disc disease most pronounced C6-7. Upper chest: Apical scarring. Other: None IMPRESSION: No acute intracranial process. No acute cervical spine fracture. No acute maxillofacial fracture. Electronically Signed   By: Lovey Newcomer M.D.   On: 08/04/2019 20:20   Dg Knee Complete 4 Views Right  Result Date: 08/04/2019 CLINICAL DATA:  Fall with knee injury EXAM: RIGHT KNEE - COMPLETE 4+ VIEW COMPARISON:  08/03/2016 FINDINGS: Surgical plate and multiple screw fixation of the distal femur. No acute displaced fracture or malalignment. Joint space calcifications with mild degenerative changes. Trace knee effusion. IMPRESSION: Postsurgical changes of the distal femur. No definite acute osseous abnormality. Electronically Signed   By: Donavan Foil M.D.   On: 08/04/2019 20:21   Ct Maxillofacial Wo Cm  Result Date: 08/04/2019 CLINICAL DATA:  Patient status post fall.  Right eyebrow laceration. EXAM: CT HEAD WITHOUT CONTRAST CT MAXILLOFACIAL WITHOUT CONTRAST CT CERVICAL SPINE WITHOUT CONTRAST TECHNIQUE: Multidetector CT imaging of the  head, cervical spine, and maxillofacial structures were performed using the standard protocol without intravenous contrast. Multiplanar CT image reconstructions of the cervical spine and maxillofacial structures were also generated. COMPARISON:  None. FINDINGS: CT HEAD FINDINGS Brain: Ventricles and sulci are appropriate for patient's age. Periventricular and subcortical white matter hypodensity compatible with chronic microvascular ischemic changes. Vascular: Unremarkable Skull: Intact Other: None CT MAXILLOFACIAL FINDINGS Osseous: No fracture or mandibular dislocation. No destructive process. Orbits: Intact Sinuses: Unremarkable Soft tissues: Right periorbital soft tissue swelling. CT CERVICAL SPINE FINDINGS Alignment: Straightening of the normal cervical lordosis. Skull base and vertebrae: Intact.  Mild degenerative changes. Soft tissues and spinal canal: No prevertebral  fluid or swelling. No visible canal hematoma. Disc levels: No acute fracture. Degenerative disc disease most pronounced C6-7. Upper chest: Apical scarring. Other: None IMPRESSION: No acute intracranial process. No acute cervical spine fracture. No acute maxillofacial fracture. Electronically Signed   By: Lovey Newcomer M.D.   On: 08/04/2019 20:20    Procedures Procedures (including critical care time)  Medications Ordered in ED Medications  acetaminophen (TYLENOL) tablet 650 mg (650 mg Oral Refused 08/04/19 2014)  Tdap (BOOSTRIX) injection 0.5 mL (0.5 mLs Intramuscular Given 08/04/19 2015)     Initial Impression / Assessment and Plan / ED Course  I have reviewed the triage vital signs and the nursing notes.  Pertinent labs & imaging results that were available during my care of the patient were reviewed by me and considered in my medical decision making (see chart for details).        Patient presenting with head trauma and knee pain after mechanical fall, on Coumadin.  No LOC.  She is overall well-appearing and in no distress.   She has bruising and superficial abrasions to right brow and eye.  Some tenderness to the right knee as well.  No neuro deficits.  Patient is ambulatory in the room with steady gait.  No entrapment or evidence of ocular trauma.  CT imaging of head, C-spine and max face are negative for acute bleed or fracture.  Right knee imaging is negative.  Patient is in no distress.  Wounds cleaned, topical bacitracin and wound care applied.  Tdap updated.  Discussed symptomatic management, close PCP follow-up and strict return precautions.  Patient verbalized understanding agrees with care plan for discharge.  Patient discussed with evaluated Dr. Sedonia Small, who agrees with care plan and discharge.  Discussed results, findings, treatment and follow up. Patient advised of return precautions. Patient verbalized understanding and agreed with plan.  Final Clinical Impressions(s) / ED Diagnoses   Final diagnoses:  Contusion of face, initial encounter  Fall in home, initial encounter  Acute pain of right knee    ED Discharge Orders    None       Briah Nary, [place] N, PA-C 08/04/19 2109    Maudie Flakes, MD 08/05/19 [phone]

## 2019-08-04 NOTE — ED Notes (Signed)
Patient transported to CT 

## 2021-08-24 IMAGING — CT CT HEAD W/O CM
4 series · 15 of 47 positions shown, 17 images · non-contrast
Comparison: None.

CLINICAL DATA: Patient status post fall.  Right eyebrow laceration.

EXAM:
CT HEAD WITHOUT CONTRAST
CT MAXILLOFACIAL WITHOUT CONTRAST
CT CERVICAL SPINE WITHOUT CONTRAST
TECHNIQUE: Multidetector CT imaging of the head, cervical spine, and
maxillofacial structures were performed using the standard protocol
without intravenous contrast. Multiplanar CT image reconstructions
of the cervical spine and maxillofacial structures were also
generated.

[Series 2: head wo · axial · 0.43mm/px · z∈[-158,-48]mm · 7 of 30 slices shown, 9 images]
[im 4/30  brain]
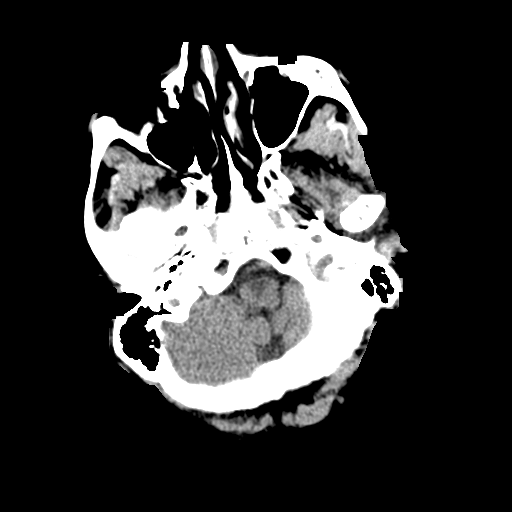
[im 4/30  bone]
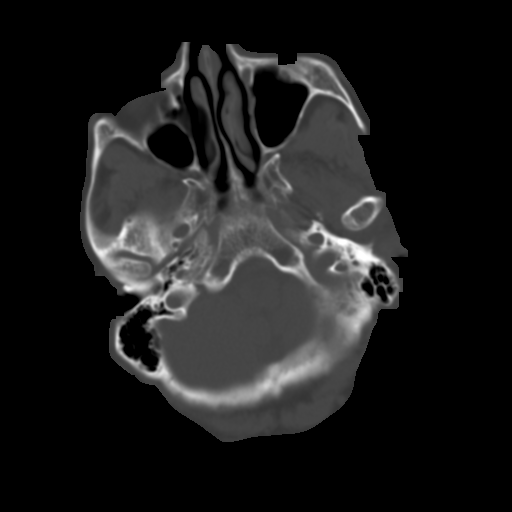
[im 8/30  brain]
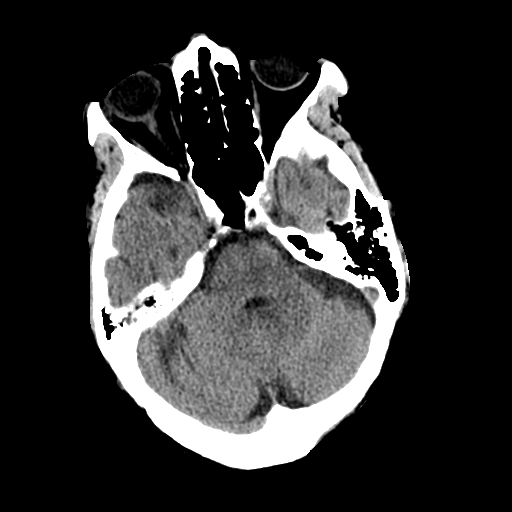
[im 11/30  brain]
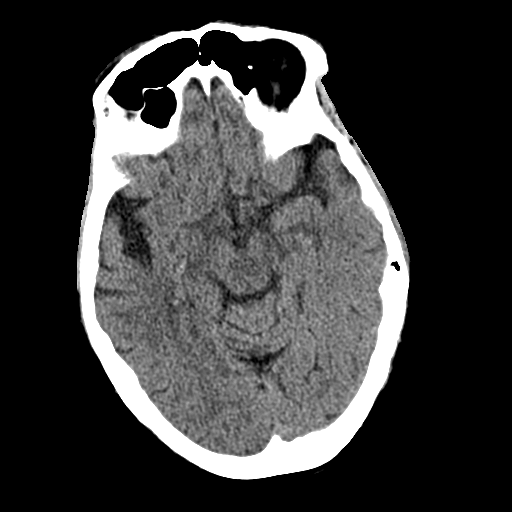
[im 15/30  brain]
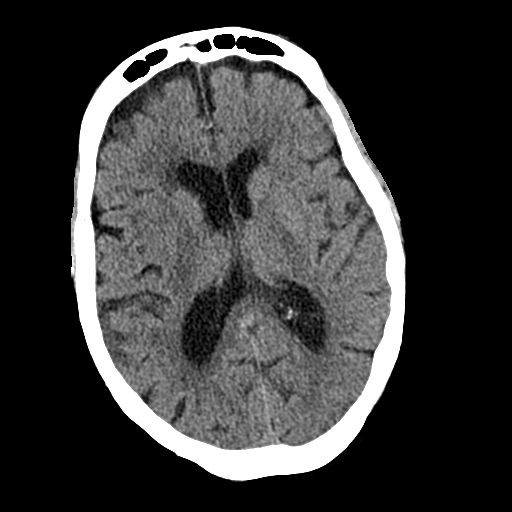
[im 19/30  brain]
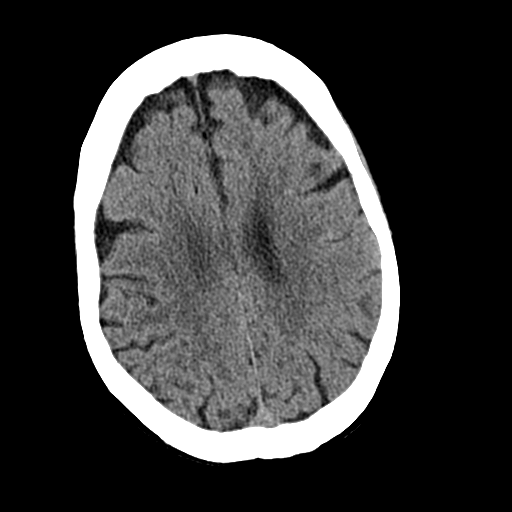
[im 19/30  bone]
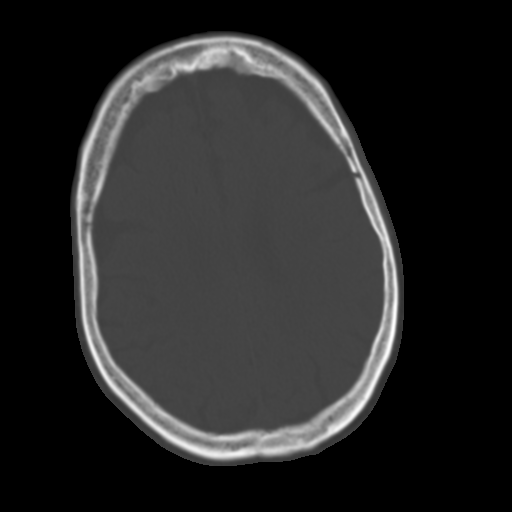
[im 22/30  brain]
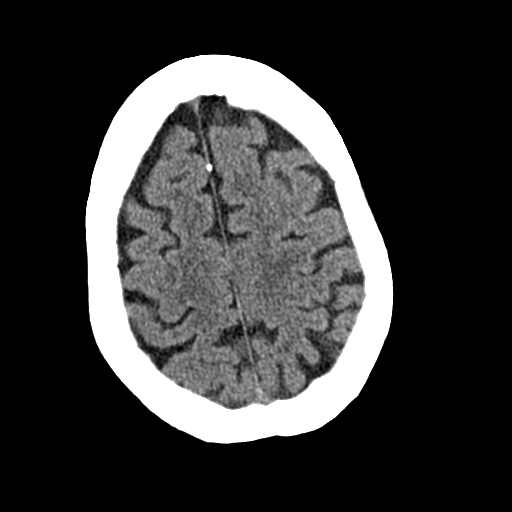
[im 26/30  brain]
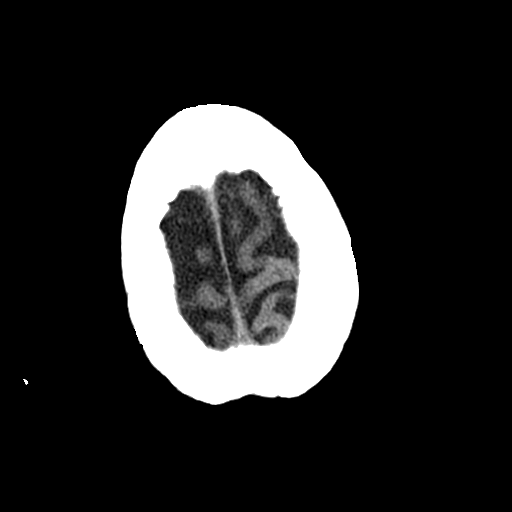

[Series 3: head bone · axial · 0.43mm/px · z∈[-159,-145]mm · 2 of 75 slices shown]
[im 8/75  bone]
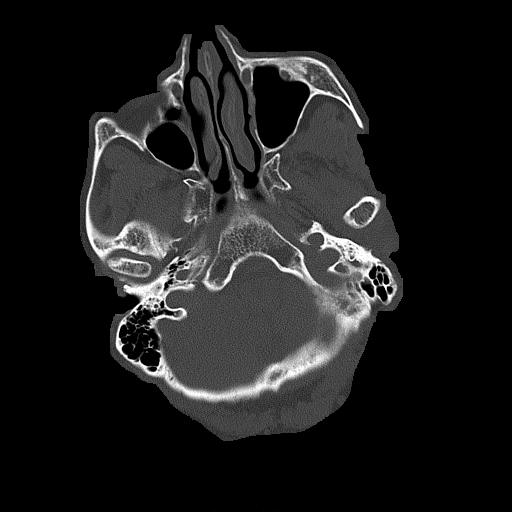
[im 15/75  bone]
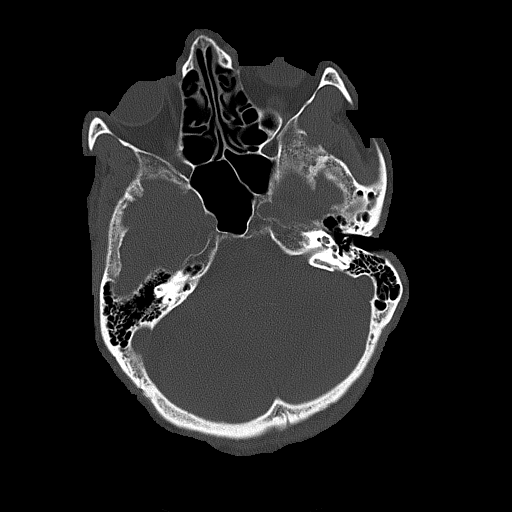

[Series 4: head coronal · coronal · 0.29mm/px · 3 of 79 slices shown]
[im 27/79  brain]
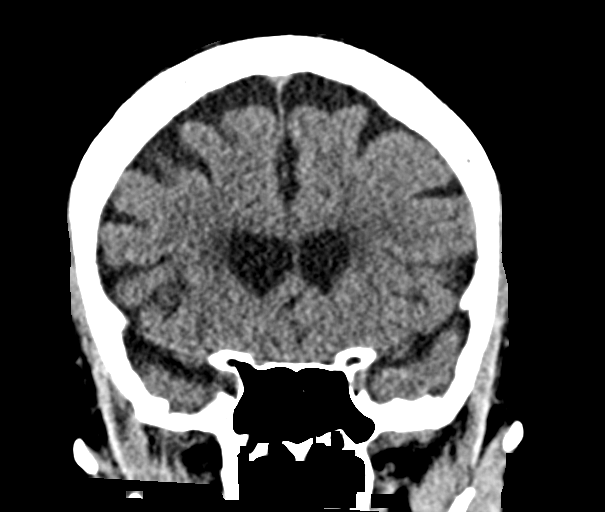
[im 35/79  brain]
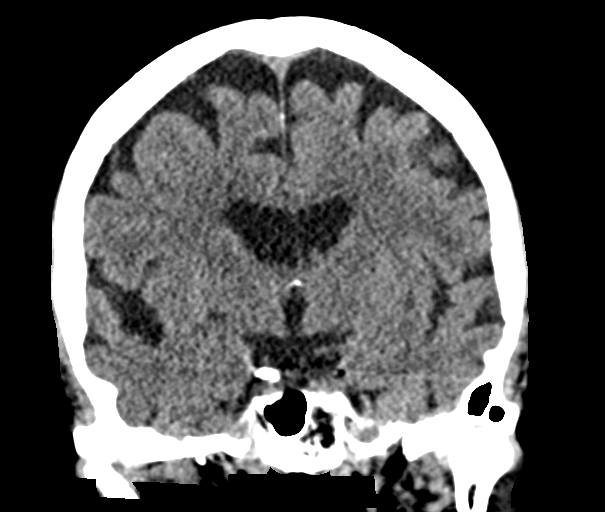
[im 44/79  brain]
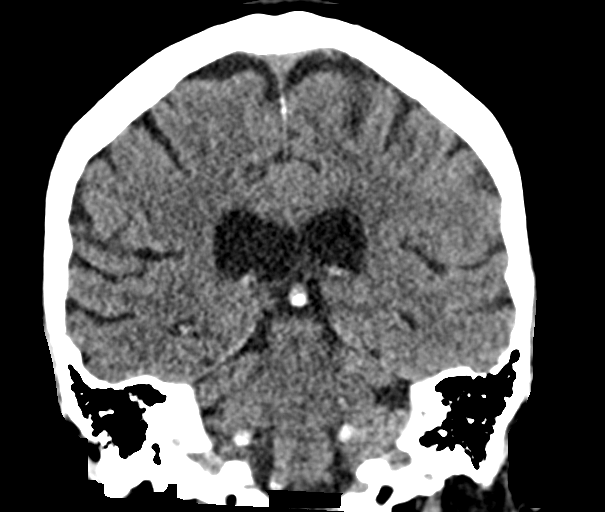

[Series 5: head sagittal · sagittal · 0.32mm/px · 3 of 61 slices shown]
[im 21/61  brain]
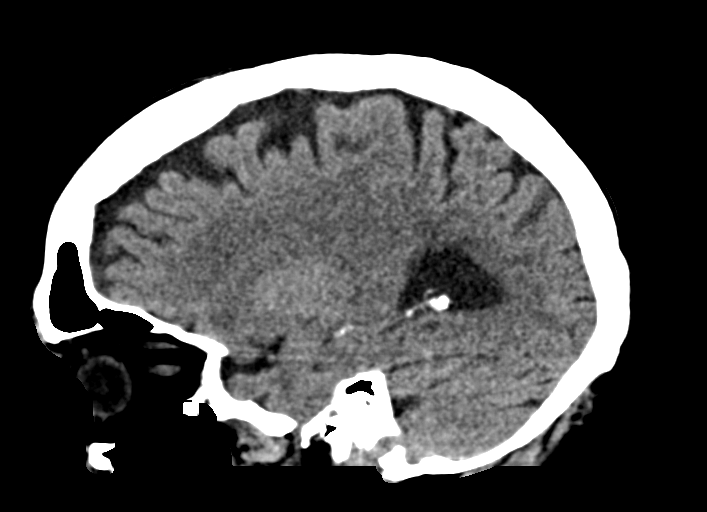
[im 31/61  brain]
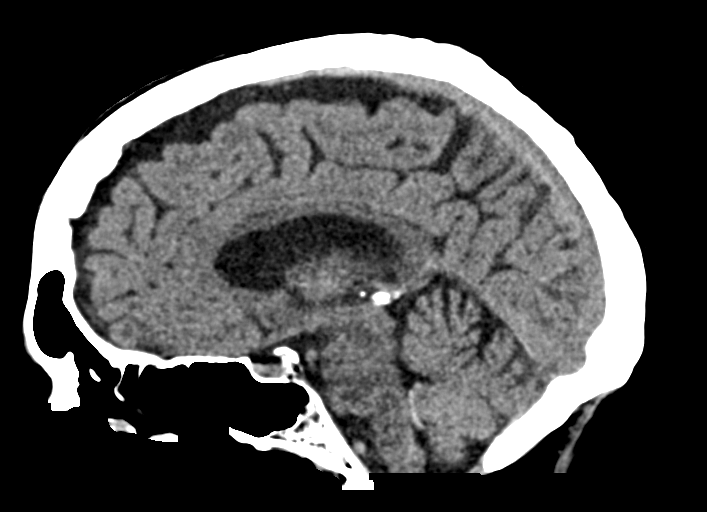
[im 41/61  brain]
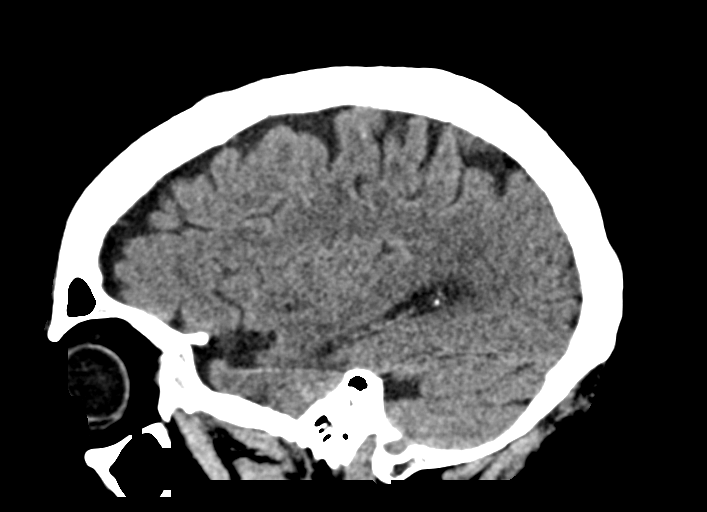

[15 of 47 positions shown; findings below may reference images not displayed]

FINDINGS: CT HEAD FINDINGS

Brain: Ventricles and sulci are appropriate for patient's age.
Periventricular and subcortical white matter hypodensity compatible
with chronic microvascular ischemic changes.

Vascular: Unremarkable

Skull: Intact

Other: None

CT MAXILLOFACIAL FINDINGS

Osseous: No fracture or mandibular dislocation. No destructive
process.

Orbits: Intact

Sinuses: Unremarkable

Soft tissues: Right periorbital soft tissue swelling.

CT CERVICAL SPINE FINDINGS

Alignment: Straightening of the normal cervical lordosis.

Skull base and vertebrae: Intact.  Mild degenerative changes.

Soft tissues and spinal canal: No prevertebral fluid or swelling. No
visible canal hematoma.

Disc levels: No acute fracture. Degenerative disc disease most
pronounced C6-7.

Upper chest: Apical scarring.

Other: None
IMPRESSION: No acute intracranial process.

No acute cervical spine fracture.

No acute maxillofacial fracture.

## 2022-11-02 ENCOUNTER — Emergency Department (HOSPITAL_BASED_OUTPATIENT_CLINIC_OR_DEPARTMENT_OTHER): Payer: Medicare Other

## 2022-11-02 ENCOUNTER — Encounter (HOSPITAL_BASED_OUTPATIENT_CLINIC_OR_DEPARTMENT_OTHER): Payer: Self-pay | Admitting: Radiology

## 2022-11-02 ENCOUNTER — Emergency Department (HOSPITAL_BASED_OUTPATIENT_CLINIC_OR_DEPARTMENT_OTHER)
Admission: EM | Admit: 2022-11-02 | Discharge: 2022-11-02 | Disposition: A | Discharge status: Home / Self Care | Payer: Medicare Other | Attending: Emergency Medicine | Admitting: Emergency Medicine

## 2022-11-02 ENCOUNTER — Other Ambulatory Visit: Payer: Self-pay

## 2022-11-02 DIAGNOSIS — I10 Essential (primary) hypertension: Secondary | ICD-10-CM | POA: Diagnosis not present

## 2022-11-02 DIAGNOSIS — Z7901 Long term (current) use of anticoagulants: Secondary | ICD-10-CM | POA: Insufficient documentation

## 2022-11-02 DIAGNOSIS — Z79899 Other long term (current) drug therapy: Secondary | ICD-10-CM | POA: Diagnosis not present

## 2022-11-02 DIAGNOSIS — K5900 Constipation, unspecified: Secondary | ICD-10-CM | POA: Insufficient documentation

## 2022-11-02 DIAGNOSIS — I4891 Unspecified atrial fibrillation: Secondary | ICD-10-CM | POA: Insufficient documentation

## 2022-11-02 LAB — COMPREHENSIVE METABOLIC PANEL
ALT: 9 U/L (ref 0–44)
AST: 18 U/L (ref 15–41)
Albumin: 4 g/dL (ref 3.5–5.0)
Alkaline Phosphatase: 86 U/L (ref 38–126)
Anion gap: 10 (ref 5–15)
BUN: 20 mg/dL (ref 8–23)
CO2: 29 mmol/L (ref 22–32)
Calcium: 12.2 mg/dL — ABNORMAL HIGH (ref 8.9–10.3)
Chloride: 101 mmol/L (ref 98–111)
Creatinine, Ser: 0.87 mg/dL (ref 0.44–1.00)
GFR, Estimated: >60 mL/min (ref >60)
Glucose, Bld: 92 mg/dL (ref 70–99)
Potassium: 4.4 mmol/L (ref 3.5–5.1)
Sodium: 140 mmol/L (ref 135–145)
Total Bilirubin: 0.6 mg/dL (ref 0.3–1.2)
Total Protein: 6.8 g/dL (ref 6.5–8.1)

## 2022-11-02 LAB — CBC WITH DIFFERENTIAL/PLATELET
Abs Immature Granulocytes: 0.01 10*3/uL (ref 0.00–0.07)
Basophils Absolute: 0 10*3/uL (ref 0.0–0.1)
Basophils Relative: 0 %
Eosinophils Absolute: 0 10*3/uL (ref 0.0–0.5)
Eosinophils Relative: 0 %
HCT: 42 % (ref 36.0–46.0)
Hemoglobin: 13.6 g/dL (ref 12.0–15.0)
Immature Granulocytes: 0 %
Lymphocytes Relative: 20 %
Lymphs Abs: 1.1 10*3/uL (ref 0.7–4.0)
MCH: 29.7 pg (ref 26.0–34.0)
MCHC: 32.4 g/dL (ref 30.0–36.0)
MCV: 91.7 fL (ref 80.0–100.0)
Monocytes Absolute: 0.5 10*3/uL (ref 0.1–1.0)
Monocytes Relative: 8 %
Neutro Abs: 4.1 10*3/uL (ref 1.7–7.7)
Neutrophils Relative %: 72 %
Platelets: 217 10*3/uL (ref 150–400)
RBC: 4.58 MIL/uL (ref 3.87–5.11)
RDW: 12.9 % (ref 11.5–15.5)
WBC: 5.8 10*3/uL (ref 4.0–10.5)
nRBC: 0 % (ref 0.0–0.2)

## 2022-11-02 LAB — LIPASE, BLOOD: Lipase: <10 U/L — ABNORMAL LOW (ref 11–51)

## 2022-11-02 LAB — MAGNESIUM: Magnesium: 1.5 mg/dL — ABNORMAL LOW (ref 1.7–2.4)

## 2022-11-02 MED ORDER — MAGNESIUM OXIDE -MG SUPPLEMENT 400 (240 MG) MG PO TABS
400.0000 mg | ORAL_TABLET | ORAL | Status: DC
  Filled 2022-11-02: qty 1

## 2022-11-02 MED ORDER — MAGNESIUM OXIDE -MG SUPPLEMENT 400 (240 MG) MG PO TABS
400.0000 mg | ORAL_TABLET | Freq: Once | ORAL | Status: AC
  Administered 2022-11-02: 400 mg via ORAL

## 2022-11-02 MED ORDER — IOHEXOL 300 MG/ML  SOLN
100.0000 mL | Freq: Once | INTRAMUSCULAR | Status: AC | PRN
  Administered 2022-11-02: 80 mL via INTRAVENOUS

## 2022-11-02 MED ORDER — MAGNESIUM CITRATE PO SOLN: 0.5000 | Freq: Once | ORAL | Status: DC

## 2022-11-02 NOTE — ED Notes (Signed)
Discharge paperwork given and verbally understood. 

## 2022-11-02 NOTE — Discharge Instructions (Signed)
Take 8 scoops of miralax in 32oz of whatever you would like to drink.(Gatorade comes in this size) You can also use a fleets enema which you can buy over the counter at the pharmacy.  Return for worsening abdominal pain, vomiting or fever. ? ?

## 2022-11-02 NOTE — ED Triage Notes (Signed)
Pt has not had a BM in 5 weeks. Pt has a L1 compression fracture from 12/12. Pt has tried OTC enemas, stool softeners and suppositories no relief.

## 2022-11-02 NOTE — ED Provider Notes (Signed)
[name] Provider Note   CSN: [id number] Arrival date & time: 11/02/22  1110     History  Chief Complaint  Patient presents with   Constipation    Deborah Cox is a 88 y.o. female.  88 year old female history of collagenous colitis, hypertension, atrial fibrillation on warfarin, and AAA who presents emergency department with constipation.  Patient states that she has had 4 weeks of constipation.  Reports no bowel movements in that time.  Tried enema and suppository without relief.  Says that she is having lower abdominal pain and did have an episode of vomiting several days ago.  Does have an L1 compression fracture and was started on tramadol several weeks ago.  Not on any laxatives. No other new medication changes.  Says that her back has been hurting but denies any lower extremity weakness or numbness or bowel or bladder incontinence.  No changes in sensation when she wipes.  Only abdominal surgery is an appendectomy.       Home Medications Prior to Admission medications   Medication Sig Start Date End Date Taking? Authorizing Provider  atorvastatin (LIPITOR) 20 MG tablet Take 20 mg by mouth at bedtime.     [provider]  diltiazem (CARDIZEM) 60 MG tablet Take 60 mg by mouth 2 (two) times daily.     [provider]  flecainide (TAMBOCOR) 50 MG tablet Take 25 mg by mouth 2 (two) times daily. Take 1/2 of a 50 mg tablet to = 25 mg BID    [provider]  HYDROcodone-acetaminophen (NORCO/VICODIN) 5-325 MG tablet Take one tablet by mouth every 8 hours as needed for moderate to severe pain. Hold for sedation. DNE 3gm of Tylenol in 24 hours 10/19/16   Reed, Tiffany L, DO  lisinopril (PRINIVIL,ZESTRIL) 20 MG tablet Take 20 mg by mouth daily.     [provider]  Menthol (ICY HOT) 5 % PTCH Apply 1 patch topically daily. Apply to mid back.  Check for skin integrity,    [provider]  NUTRITIONAL SUPPLEMENT  LIQD Take 120 mLs by mouth 3 (three) times daily. [employer]    [provider]  polyethylene glycol (MIRALAX / GLYCOLAX) packet Take 17 g by mouth daily.    [provider]  sennosides-docusate sodium (SENOKOT-S) 8.6-50 MG tablet Take 2 tablets by mouth at bedtime.    [provider]  traZODone (DESYREL) 50 MG tablet Take 25 mg by mouth at bedtime as needed. Give 1/2 of a 50 mg tablet to = 25 mg qhs prn    [provider]  warfarin (COUMADIN) 6 MG tablet Take 6 mg by mouth 4 (four) times a week. Tue-Thu-Sat-Sun    [provider]  warfarin (COUMADIN) 7.5 MG tablet Take 7.5 mg by mouth 3 (three) times a week. M-W-F    [provider]      Allergies    Sulfa antibiotics    Review of Systems   Review of Systems  Physical Exam Updated Vital Signs BP (!) 156/94   Pulse 100   Temp 98.7 F (37.1 C) (Oral)   Resp 18   Wt 56.7 kg   SpO2 98%   BMI 20.80 kg/m  Physical Exam Vitals and nursing note reviewed.  Constitutional:      General: She is not in acute distress.    Appearance: She is well-developed.  HENT:     Head: Normocephalic and atraumatic.     Right Ear: External ear normal.  Left Ear: External ear normal.     Nose: Nose normal.  Eyes:     Extraocular Movements: Extraocular movements intact.     Conjunctiva/sclera: Conjunctivae normal.     Pupils: Pupils are equal, round, and reactive to light.  Pulmonary:     Effort: Pulmonary effort is normal. No respiratory distress.  Abdominal:     General: Abdomen is flat. There is no distension.     Palpations: Abdomen is soft. There is no mass.     Tenderness: There is abdominal tenderness (lower abdomen). There is no guarding.  Musculoskeletal:     Cervical back: Normal range of motion and neck supple.  Skin:    General: Skin is warm and dry.  Neurological:     Mental Status: She is alert and oriented to person, place, and time. Mental status is at baseline.   Psychiatric:        Mood and Affect: Mood normal.     ED Results / Procedures / Treatments   Labs (all labs ordered are listed, but only abnormal results are displayed) Labs Reviewed  COMPREHENSIVE METABOLIC PANEL - Abnormal; Notable for the following components:      Result Value   Calcium 12.2 (*)    All other components within normal limits  LIPASE, BLOOD - Abnormal; Notable for the following components:   Lipase <10 (*)    All other components within normal limits  MAGNESIUM - Abnormal; Notable for the following components:   Magnesium 1.5 (*)    All other components within normal limits  CBC WITH DIFFERENTIAL/PLATELET    EKG None  Radiology DG Abdomen 1 View  Result Date: 11/02/2022 CLINICAL DATA:  Constipation EXAM: ABDOMEN - 1 VIEW COMPARISON:  None Available. FINDINGS: Bowel gas pattern is nonspecific. Small to moderate amount of stool is seen in colon and rectum. There is no fecal impaction in rectosigmoid. No abnormal masses or calcifications are seen. Visualized lower lung fields are clear. Moderate to severe degenerative changes are noted in lumbar spine. There is decrease in height of multiple lumbar vertebral bodies. Compression fractures were seen in multiple lumbar and thoracic vertebral bodies in the previous CT done on 12/31/2017. There is previous right hip arthroplasty. IMPRESSION: Nonspecific bowel gas pattern. Small to moderate amount of stool is seen in colon without signs of fecal impaction in rectosigmoid. Lumbar spondylosis. There is decrease in height of multiple lumbar vertebral bodies, possibly suggesting old fractures. Electronically Signed   By: Elmer Picker M.D.   On: 11/02/2022 12:16    Procedures Procedures   Medications Ordered in ED Medications  magnesium oxide (MAG-OX) tablet 400 mg (has no administration in time range)  iohexol (OMNIPAQUE) 300 MG/ML solution 100 mL (has no administration in time range)    ED Course/ Medical  Decision Making/ A&P Clinical Course as of 11/02/22 1659  Thu Nov 02, 2022  1656 Signed out to Dr Tyrone Nine.  [RP]    Clinical Course User Index [RP] Fransico Meadow, MD                           Medical Decision Making Amount and/or Complexity of Data Reviewed Labs: ordered. Radiology: ordered.  Risk OTC drugs. Prescription drug management.   Waynesha Rammel is a 88 y.o. female with comorbidities that complicate the patient evaluation including collagenous colitis, hypertension, atrial fibrillation on warfarin, and AAA who presents emergency department with constipation.   Initial Ddx:  Idiopathic constipation, drug-induced  constipation, bowel obstruction, malignancy  MDM:  Unclear what is causing patient's symptoms.  May be constipation due to her initiating tramadol without starting a laxative.  Overall well-appearing for bowel obstruction and has not had significant nausea and vomiting in the past few days.  However, given her age and lower abdominal pain we will obtain a CT scan to evaluate for possible obstruction or malignancy.  Will also obtain lab work to evaluate for electrolyte abnormalities that could cause an ileus.  Patient not requesting pain medication or nausea medication at this time.  Plan:  Labs CT scan  ED Summary/Re-evaluation:  Patient reassessed and was stable in the emergency department no significant nausea and vomiting.  Magnesium was low so patient was given magnesium oxide.  Had an abdominal x-ray ordered from triage that did not show evidence of bowel obstruction.  She is awaiting her CT scan at this time.  Signed out to Dr. Tyrone Nine awaiting her imaging results.  This patient presents to the ED for concern of complaints listed in HPI, this involves an extensive number of treatment options, and is a complaint that carries with it a high risk of complications and morbidity. Disposition including potential need for admission considered.   Dispo: Pending  remainder of workup  Additional history obtained from daughter Records reviewed Outpatient Clinic Notes The following labs were independently interpreted: Chemistry and show  hypomagnesemia I independently reviewed the following imaging with scope of interpretation limited to determining acute life threatening conditions related to emergency care:  Abdominal x-ray  and agree with the radiologist interpretation with the following exceptions: None Social Determinants of health:  Elderly  Final Clinical Impression(s) / ED Diagnoses Final diagnoses:  Constipation, unspecified constipation type    Rx / DC Orders ED Discharge Orders     None         Fransico Meadow, MD 11/02/22 1659

## 2022-11-02 NOTE — ED Provider Notes (Signed)
Received from Dr. Sharlett Iles.  Patient with constipation, back pain.  Plan for CT imaging.  CT scan of his come back and shows a L1 compression fracture which the patient already knew.  No significant intra-abdominal pathology.  She has an adnexal cyst that is enlarged since last imaging.  I discussed this with the patient.  She will follow-up with her family doctor.  Discussed a MiraLAX cleanout protocol.   Deno Etienne, DO 11/02/22 1915
